# Patient Record
Sex: Male | Born: 1970 | Hispanic: No | Smoking: Never smoker
Health system: Southern US, Community
[De-identification: ages and names within clinical notes are randomized; demographics above are authoritative.]

## PROBLEM LIST (undated history)

## (undated) ENCOUNTER — Emergency Department (HOSPITAL_COMMUNITY): Discharge status: Absent without leave | Payer: Self-pay

## (undated) ENCOUNTER — Emergency Department: Discharge status: Absent without leave | Payer: No Typology Code available for payment source

## (undated) ENCOUNTER — Emergency Department (HOSPITAL_BASED_OUTPATIENT_CLINIC_OR_DEPARTMENT_OTHER): Discharge status: Absent without leave | Payer: Self-pay

---

## 2014-12-27 ENCOUNTER — Ambulatory Visit: Payer: Self-pay | Attending: Family Medicine | Admitting: Family Medicine

## 2014-12-27 ENCOUNTER — Encounter: Payer: Self-pay | Admitting: Family Medicine

## 2014-12-27 VITALS — BP 114/73 | HR 57 | Temp 98.5°F | Resp 16 | Ht 70.0 in | Wt 174.0 lb

## 2014-12-27 DIAGNOSIS — H5213 Myopia, bilateral: Secondary | ICD-10-CM

## 2014-12-27 DIAGNOSIS — H5203 Hypermetropia, bilateral: Secondary | ICD-10-CM

## 2014-12-27 DIAGNOSIS — H5210 Myopia, unspecified eye: Secondary | ICD-10-CM | POA: Insufficient documentation

## 2014-12-27 DIAGNOSIS — Z0000 Encounter for general adult medical examination without abnormal findings: Secondary | ICD-10-CM

## 2014-12-27 DIAGNOSIS — H5200 Hypermetropia, unspecified eye: Secondary | ICD-10-CM | POA: Insufficient documentation

## 2014-12-27 LAB — POCT GLYCOSYLATED HEMOGLOBIN (HGB A1C): Hemoglobin A1C: 5.8

## 2014-12-27 NOTE — Assessment & Plan Note (Signed)
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

## 2014-12-27 NOTE — Patient Instructions (Addendum)
Jeffery Jimenez,  Thank you for coming in today. It was a pleasure meeting you. I look forward to being your primary doctor. You A1c today is 5.8, no diabetes.   Low out cost optometrist (about $65.00 for office visit)  1. Dr. [name] Phone # [phone] [address]  [place] [address]   2. [facility]  Phone # [phone] [address] [place], [place] [address]    F/u in 4-6 weeks for full physical   Dr. Armen PickupFunches

## 2014-12-27 NOTE — Progress Notes (Signed)
Patient here to establish care Patient is very healthy he is black belt in karate C/o blurry vision near and farsighted that has been getting progressively worse No pain Refuses flu shot

## 2014-12-27 NOTE — Progress Notes (Signed)
Went to discharge patient and he had left the clinic-AVS not given

## 2014-12-27 NOTE — Progress Notes (Signed)
   Subjective:    Patient ID: Jeffery Jimenez, male    DOB: 1971/09/10, 44 y.o.   MRN: [id number] CC: establish care, poor vision x one year  HPI 44 yo M establish care:  1. Poor vision: poor near vision x one year. Patient cannot read books, see his phone screen or see his driver's license very well. No history of trauma to eye or eye pain. No personal history of requiring corrective lenses.   Soc Hx: non smoker  Med Hx: negative Fam Hx: father with heart disease    Review of Systems As per HPI     Objective:   Physical Exam BP 114/73 mmHg  Pulse 57  Temp(Src) 98.5 F (36.9 C)  Resp 16  Ht 5\' 10"  (1.778 m)  Wt 174 lb (78.926 kg)  BMI 24.97 kg/m2  SpO2 97% General appearance: alert, cooperative and no distress Eyes: conjunctivae/corneas clear. PERRL, EOM's intact.  Lungs: normal WOB     Assessment & Plan:

## 2015-03-22 ENCOUNTER — Emergency Department (HOSPITAL_COMMUNITY)
Admission: EM | Admit: 2015-03-22 | Discharge: 2015-03-22 | Disposition: A | Discharge status: Home / Self Care | Payer: Self-pay | Attending: Emergency Medicine | Admitting: Emergency Medicine

## 2015-03-22 ENCOUNTER — Emergency Department (HOSPITAL_COMMUNITY): Payer: Self-pay

## 2015-03-22 ENCOUNTER — Encounter (HOSPITAL_COMMUNITY): Payer: Self-pay | Admitting: *Deleted

## 2015-03-22 DIAGNOSIS — H9209 Otalgia, unspecified ear: Secondary | ICD-10-CM | POA: Insufficient documentation

## 2015-03-22 DIAGNOSIS — J069 Acute upper respiratory infection, unspecified: Secondary | ICD-10-CM | POA: Insufficient documentation

## 2015-03-22 DIAGNOSIS — R062 Wheezing: Secondary | ICD-10-CM

## 2015-03-22 MED ORDER — DEXAMETHASONE 4 MG PO TABS
12.0000 mg | ORAL_TABLET | Freq: Once | ORAL | Status: AC
  Administered 2015-03-22: 12 mg via ORAL
  Filled 2015-03-22: qty 3

## 2015-03-22 MED ORDER — HYDROCOD POLST-CHLORPHEN POLST 10-8 MG/5ML PO LQCR: 5.0000 mL | Freq: Two times a day (BID) | ORAL | Status: AC | PRN

## 2015-03-22 MED ORDER — ALBUTEROL SULFATE HFA 108 (90 BASE) MCG/ACT IN AERS: 2.0000 | INHALATION_SPRAY | Freq: Four times a day (QID) | RESPIRATORY_TRACT | Status: AC | PRN

## 2015-03-22 MED ORDER — ALBUTEROL SULFATE HFA 108 (90 BASE) MCG/ACT IN AERS
2.0000 | INHALATION_SPRAY | Freq: Once | RESPIRATORY_TRACT | Status: AC
  Administered 2015-03-22: 2 via RESPIRATORY_TRACT
  Filled 2015-03-22: qty 6.7

## 2015-03-22 MED ORDER — HYDROCOD POLST-CHLORPHEN POLST 10-8 MG/5ML PO LQCR
5.0000 mL | Freq: Once | ORAL | Status: AC
  Administered 2015-03-22: 5 mL via ORAL
  Filled 2015-03-22: qty 5

## 2015-03-22 MED ORDER — IPRATROPIUM-ALBUTEROL 0.5-2.5 (3) MG/3ML IN SOLN
3.0000 mL | Freq: Once | RESPIRATORY_TRACT | Status: AC
  Administered 2015-03-22: 3 mL via RESPIRATORY_TRACT
  Filled 2015-03-22: qty 3

## 2015-03-22 NOTE — ED Provider Notes (Signed)
CSN: [id number]     Arrival date & time 03/22/15  0217 History   First MD Initiated Contact with Patient 03/22/15 0459     Chief Complaint  Patient presents with  . Nasal Congestion  . Cough     (Consider location/radiation/quality/duration/timing/severity/associated sxs/prior Treatment) Patient is a 44 y.o. male presenting with cough. The history is provided by the patient.  Cough Cough characteristics:  Non-productive Severity:  Mild Onset quality:  Gradual Duration:  3 days Timing:  Constant Progression:  Unchanged Chronicity:  New Context: upper respiratory infection   Context: not fumes and not sick contacts   Relieved by:  Nothing Worsened by:  Nothing tried Associated symptoms: ear fullness, ear pain, rhinorrhea, sore throat and wheezing   Associated symptoms: no fever, no rash, no shortness of breath and no sinus congestion     History reviewed. No pertinent past medical history. History reviewed. No pertinent past surgical history. Family History  Problem Relation Age of Onset  . Heart disease Father    History  Substance Use Topics  . Smoking status: Never Smoker   . Smokeless tobacco: Never Used  . Alcohol Use: No    Review of Systems  Constitutional: Negative for fever.  HENT: Positive for ear pain, rhinorrhea and sore throat.   Respiratory: Positive for wheezing. Negative for cough and shortness of breath.   Gastrointestinal: Negative for vomiting.  Skin: Negative for rash.  All other systems reviewed and are negative.     Allergies  Review of patient's allergies indicates no known allergies.  Home Medications   Prior to Admission medications   Medication Sig Start Date End Date Taking? Authorizing Provider  Phenylephrine-DM-GG 2.5-5-100 MG/5ML LIQD Take 30 mLs by mouth every 4 (four) hours as needed (congestion).   Yes Historical Provider, MD  albuterol (PROVENTIL HFA;VENTOLIN HFA) 108 (90 BASE) MCG/ACT inhaler Inhale 2 puffs into the lungs  every 6 (six) hours as needed for wheezing or shortness of breath. 03/22/15   Elwin MochaBlair Anais Koenen, MD  chlorpheniramine-HYDROcodone (TUSSIONEX) 10-8 MG/5ML LQCR Take 5 mLs by mouth every 12 (twelve) hours as needed for cough. 03/22/15   Elwin MochaBlair Caedyn Raygoza, MD   BP 137/83 mmHg  Pulse 61  Temp(Src) 97.2 F (36.2 C) (Oral)  Resp 19  SpO2 97% Physical Exam  Constitutional: He is oriented to person, place, and time. He appears well-developed and well-nourished. No distress.  HENT:  Head: Normocephalic and atraumatic.  Right Ear: Tympanic membrane normal.  Left Ear: Tympanic membrane normal.  Mouth/Throat: Oropharynx is clear and moist. No oropharyngeal exudate.  Eyes: EOM are normal. Pupils are equal, round, and reactive to light.  Neck: Normal range of motion. Neck supple.  Cardiovascular: Normal rate and regular rhythm.  Exam reveals no friction rub.   No murmur heard. Pulmonary/Chest: Effort normal. No respiratory distress. He has wheezes (mild, all lung fields). He has no rales.  Abdominal: He exhibits no distension. There is no tenderness. There is no rebound.  Musculoskeletal: Normal range of motion. He exhibits no edema.  Neurological: He is alert and oriented to person, place, and time.  Skin: He is not diaphoretic.    ED Course  Procedures (including critical care time) Labs Review Labs Reviewed - No data to display  Imaging Review Dg Chest 2 View (if Patient Has Fever And/or Copd)  03/22/2015   CLINICAL DATA:  Nasal congestion and cough.  EXAM: CHEST  2 VIEW  COMPARISON:  None.  FINDINGS: When accounting for mildly low lung volumes and  interstitial crowding there is no pneumonia or edema. No effusion or pneumothorax. Normal heart size and aortic contours.  IMPRESSION: No active cardiopulmonary disease.   Electronically Signed   By: Marnee Spring M.D.   On: 03/22/2015 04:00     EKG Interpretation None      MDM   Final diagnoses:  URI (upper respiratory infection)  Wheezing     40M here with cough for 3 days. No fevers. Nonproductive cough. Associated otalgia, rhinorrhea, sore throat, wheezing. No hx of smoking. AFVSS here. CXR normal. Wheezing in all lung fields. Oropharynx clear. TMs clear. No abdominal pain. Given cough medicine, decadron. Patient given resource guide for f/u.    Elwin Mocha, MD 03/22/15 [phone]

## 2015-03-22 NOTE — Discharge Instructions (Signed)
Upper Respiratory Infection, Adult An upper respiratory infection (URI) is also sometimes known as the common cold. The upper respiratory tract includes the nose, sinuses, throat, trachea, and bronchi. Bronchi are the airways leading to the lungs. Most people improve within 1 week, but symptoms can last up to 2 weeks. A residual cough may last even longer.  CAUSES Many different viruses can infect the tissues lining the upper respiratory tract. The tissues become irritated and inflamed and often become very moist. Mucus production is also common. A cold is contagious. You can easily spread the virus to others by oral contact. This includes kissing, sharing a glass, coughing, or sneezing. Touching your mouth or nose and then touching a surface, which is then touched by another person, can also spread the virus. SYMPTOMS  Symptoms typically develop 1 to 3 days after you come in contact with a cold virus. Symptoms vary from person to person. They may include:  Runny nose.  Sneezing.  Nasal congestion.  Sinus irritation.  Sore throat.  Loss of voice (laryngitis).  Cough.  Fatigue.  Muscle aches.  Loss of appetite.  Headache.  Low-grade fever. DIAGNOSIS  You might diagnose your own cold based on familiar symptoms, since most people get a cold 2 to 3 times a year. Your caregiver can confirm this based on your exam. Most importantly, your caregiver can check that your symptoms are not due to another disease such as strep throat, sinusitis, pneumonia, asthma, or epiglottitis. Blood tests, throat tests, and X-rays are not necessary to diagnose a common cold, but they may sometimes be helpful in excluding other more serious diseases. Your caregiver will decide if any further tests are required. RISKS AND COMPLICATIONS  You may be at risk for a more severe case of the common cold if you smoke cigarettes, have chronic heart disease (such as heart failure) or lung disease (such as asthma), or if  you have a weakened immune system. The very young and very old are also at risk for more serious infections. Bacterial sinusitis, middle ear infections, and bacterial pneumonia can complicate the common cold. The common cold can worsen asthma and chronic obstructive pulmonary disease (COPD). Sometimes, these complications can require emergency medical care and may be life-threatening. PREVENTION  The best way to protect against getting a cold is to practice good hygiene. Avoid oral or hand contact with people with cold symptoms. Wash your hands often if contact occurs. There is no clear evidence that vitamin C, vitamin E, echinacea, or exercise reduces the chance of developing a cold. However, it is always recommended to get plenty of rest and practice good nutrition. TREATMENT  Treatment is directed at relieving symptoms. There is no cure. Antibiotics are not effective, because the infection is caused by a virus, not by bacteria. Treatment may include:  Increased fluid intake. Sports drinks offer valuable electrolytes, sugars, and fluids.  Breathing heated mist or steam (vaporizer or shower).  Eating chicken soup or other clear broths, and maintaining good nutrition.  Getting plenty of rest.  Using gargles or lozenges for comfort.  Controlling fevers with ibuprofen or acetaminophen as directed by your caregiver.  Increasing usage of your inhaler if you have asthma. Zinc gel and zinc lozenges, taken in the first 24 hours of the common cold, can shorten the duration and lessen the severity of symptoms. Pain medicines may help with fever, muscle aches, and throat pain. A variety of non-prescription medicines are available to treat congestion and runny nose. Your caregiver   can make recommendations and may suggest nasal or lung inhalers for other symptoms.  HOME CARE INSTRUCTIONS   Only take over-the-counter or prescription medicines for pain, discomfort, or fever as directed by your  caregiver.  Use a warm mist humidifier or inhale steam from a shower to increase air moisture. This may keep secretions moist and make it easier to breathe.  Drink enough water and fluids to keep your urine clear or pale yellow.  Rest as needed.  Return to work when your temperature has returned to normal or as your caregiver advises. You may need to stay home longer to avoid infecting others. You can also use a face mask and careful hand washing to prevent spread of the virus. SEEK MEDICAL CARE IF:   After the first few days, you feel you are getting worse rather than better.  You need your caregiver's advice about medicines to control symptoms.  You develop chills, worsening shortness of breath, or brown or red sputum. These may be signs of pneumonia.  You develop yellow or brown nasal discharge or pain in the face, especially when you bend forward. These may be signs of sinusitis.  You develop a fever, swollen neck glands, pain with swallowing, or white areas in the back of your throat. These may be signs of strep throat. SEEK IMMEDIATE MEDICAL CARE IF:   You have a fever.  You develop severe or persistent headache, ear pain, sinus pain, or chest pain.  You develop wheezing, a prolonged cough, cough up blood, or have a change in your usual mucus (if you have chronic lung disease).  You develop sore muscles or a stiff neck. Document Released: 05/22/2001 Document Revised: 02/18/2012 Document Reviewed: 03/03/2014 Exit[employer] Patient Information 2015 [employer]. This information is not intended to replace advice given to you by your health care provider. Make sure you discuss any questions you have with your health care provider.  

## 2015-03-22 NOTE — ED Notes (Signed)
Pt reports SOB with non-productive cough and chest congestion x 4 days.  Started with seasonal allergies x 3 weeks ago.  Pt reports hearing a "noise" in his chest at night.

## 2018-11-24 ENCOUNTER — Emergency Department (HOSPITAL_COMMUNITY): Payer: No Typology Code available for payment source

## 2018-11-24 ENCOUNTER — Emergency Department (HOSPITAL_COMMUNITY)
Admission: EM | Admit: 2018-11-24 | Discharge: 2018-11-24 | Disposition: A | Discharge status: Home / Self Care | Payer: No Typology Code available for payment source | Attending: Emergency Medicine | Admitting: Emergency Medicine

## 2018-11-24 ENCOUNTER — Encounter (HOSPITAL_COMMUNITY): Payer: Self-pay | Admitting: Emergency Medicine

## 2018-11-24 ENCOUNTER — Other Ambulatory Visit: Payer: Self-pay

## 2018-11-24 DIAGNOSIS — S0081XA Abrasion of other part of head, initial encounter: Secondary | ICD-10-CM

## 2018-11-24 DIAGNOSIS — Y929 Unspecified place or not applicable: Secondary | ICD-10-CM | POA: Diagnosis not present

## 2018-11-24 DIAGNOSIS — Z23 Encounter for immunization: Secondary | ICD-10-CM | POA: Diagnosis not present

## 2018-11-24 DIAGNOSIS — Y939 Activity, unspecified: Secondary | ICD-10-CM | POA: Insufficient documentation

## 2018-11-24 DIAGNOSIS — Y999 Unspecified external cause status: Secondary | ICD-10-CM | POA: Diagnosis not present

## 2018-11-24 DIAGNOSIS — Z79899 Other long term (current) drug therapy: Secondary | ICD-10-CM | POA: Diagnosis not present

## 2018-11-24 DIAGNOSIS — S060X1A Concussion with loss of consciousness of 30 minutes or less, initial encounter: Secondary | ICD-10-CM | POA: Insufficient documentation

## 2018-11-24 MED ORDER — TETANUS-DIPHTH-ACELL PERTUSSIS 5-2.5-18.5 LF-MCG/0.5 IM SUSP
0.5000 mL | Freq: Once | INTRAMUSCULAR | Status: AC
  Administered 2018-11-24: 0.5 mL via INTRAMUSCULAR
  Filled 2018-11-24: qty 0.5
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
